# Patient Record
Sex: Female | Born: 1945 | Race: White | Hispanic: No | Smoking: Never smoker
Health system: Southern US, Community
[De-identification: ages and names within clinical notes are randomized; demographics above are authoritative.]

## PROBLEM LIST (undated history)

## (undated) DIAGNOSIS — I10 Essential (primary) hypertension: Secondary | ICD-10-CM

## (undated) HISTORY — PX: APPENDECTOMY: SHX54

## (undated) HISTORY — PX: SHOULDER SURGERY: SHX246

---

## 2014-12-29 ENCOUNTER — Encounter: Payer: Self-pay | Admitting: *Deleted

## 2014-12-29 ENCOUNTER — Emergency Department
Admission: EM | Admit: 2014-12-29 | Discharge: 2014-12-29 | Disposition: A | Discharge status: Home / Self Care | Payer: No Typology Code available for payment source | Attending: Emergency Medicine | Admitting: Emergency Medicine

## 2014-12-29 ENCOUNTER — Emergency Department: Payer: No Typology Code available for payment source

## 2014-12-29 DIAGNOSIS — S8001XA Contusion of right knee, initial encounter: Secondary | ICD-10-CM | POA: Diagnosis not present

## 2014-12-29 DIAGNOSIS — Y998 Other external cause status: Secondary | ICD-10-CM | POA: Insufficient documentation

## 2014-12-29 DIAGNOSIS — S60222A Contusion of left hand, initial encounter: Secondary | ICD-10-CM | POA: Insufficient documentation

## 2014-12-29 DIAGNOSIS — I10 Essential (primary) hypertension: Secondary | ICD-10-CM | POA: Insufficient documentation

## 2014-12-29 DIAGNOSIS — Y9389 Activity, other specified: Secondary | ICD-10-CM | POA: Diagnosis not present

## 2014-12-29 DIAGNOSIS — Y92410 Unspecified street and highway as the place of occurrence of the external cause: Secondary | ICD-10-CM | POA: Diagnosis not present

## 2014-12-29 DIAGNOSIS — T148XXA Other injury of unspecified body region, initial encounter: Secondary | ICD-10-CM

## 2014-12-29 DIAGNOSIS — S80211A Abrasion, right knee, initial encounter: Secondary | ICD-10-CM | POA: Diagnosis not present

## 2014-12-29 DIAGNOSIS — M7918 Myalgia, other site: Secondary | ICD-10-CM

## 2014-12-29 DIAGNOSIS — S5012XA Contusion of left forearm, initial encounter: Secondary | ICD-10-CM | POA: Insufficient documentation

## 2014-12-29 DIAGNOSIS — S6992XA Unspecified injury of left wrist, hand and finger(s), initial encounter: Secondary | ICD-10-CM | POA: Diagnosis present

## 2014-12-29 HISTORY — DX: Essential (primary) hypertension: I10

## 2014-12-29 MED ORDER — DIAZEPAM 2 MG PO TABS: 2.0000 mg | ORAL_TABLET | Freq: Three times a day (TID) | ORAL | Status: AC | PRN

## 2014-12-29 MED ORDER — NAPROXEN 500 MG PO TABS: 500.0000 mg | ORAL_TABLET | Freq: Two times a day (BID) | ORAL | Status: AC

## 2014-12-29 MED ORDER — IBUPROFEN 600 MG PO TABS
600.0000 mg | ORAL_TABLET | Freq: Once | ORAL | Status: AC
  Administered 2014-12-29: 600 mg via ORAL
  Filled 2014-12-29: qty 1

## 2014-12-29 NOTE — ED Notes (Signed)
Portable xray at bedside.
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
## 2014-12-29 NOTE — ED Notes (Signed)
Shelley Boroughs, NP notified of BP.  OK to discharge.  Patient to follow up with PCP re/ B/P
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
## 2015-02-22 ENCOUNTER — Other Ambulatory Visit: Payer: Self-pay | Admitting: Family Medicine

## 2015-02-22 DIAGNOSIS — Z1231 Encounter for screening mammogram for malignant neoplasm of breast: Secondary | ICD-10-CM

## 2015-03-16 ENCOUNTER — Ambulatory Visit
Admission: RE | Admit: 2015-03-16 | Discharge: 2015-03-16 | Disposition: A | Discharge status: Home / Self Care | Payer: Medicare Other | Source: Ambulatory Visit | Attending: Family Medicine | Admitting: Family Medicine

## 2015-03-16 ENCOUNTER — Other Ambulatory Visit: Payer: Self-pay | Admitting: Family Medicine

## 2015-03-16 DIAGNOSIS — Z1231 Encounter for screening mammogram for malignant neoplasm of breast: Secondary | ICD-10-CM | POA: Insufficient documentation
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
## 2017-04-03 ENCOUNTER — Other Ambulatory Visit: Payer: Self-pay | Admitting: Family Medicine

## 2017-04-03 DIAGNOSIS — Z0000 Encounter for general adult medical examination without abnormal findings: Secondary | ICD-10-CM

## 2023-05-04 ENCOUNTER — Emergency Department
Admission: EM | Admit: 2023-05-04 | Discharge: 2023-05-04 | Disposition: A | Discharge status: Home / Self Care | Payer: Medicare Other | Attending: Emergency Medicine | Admitting: Emergency Medicine

## 2023-05-04 ENCOUNTER — Other Ambulatory Visit: Payer: Self-pay

## 2023-05-04 DIAGNOSIS — Z23 Encounter for immunization: Secondary | ICD-10-CM | POA: Insufficient documentation

## 2023-05-04 DIAGNOSIS — S51812A Laceration without foreign body of left forearm, initial encounter: Secondary | ICD-10-CM | POA: Diagnosis present

## 2023-05-04 DIAGNOSIS — W01198A Fall on same level from slipping, tripping and stumbling with subsequent striking against other object, initial encounter: Secondary | ICD-10-CM | POA: Insufficient documentation

## 2023-05-04 MED ORDER — TETANUS-DIPHTH-ACELL PERTUSSIS 5-2.5-18.5 LF-MCG/0.5 IM SUSY
0.5000 mL | PREFILLED_SYRINGE | Freq: Once | INTRAMUSCULAR | Status: AC
  Administered 2023-05-04: 0.5 mL via INTRAMUSCULAR
  Filled 2023-05-04: qty 0.5

## 2023-05-04 MED ORDER — LIDOCAINE-EPINEPHRINE 2 %-1:100000 IJ SOLN
20.0000 mL | Freq: Once | INTRAMUSCULAR | Status: AC
  Administered 2023-05-04: 20 mL
  Filled 2023-05-04: qty 1

## 2023-05-04 NOTE — ED Provider Notes (Signed)
[facility] Provider Note    Event Date/Time   First MD Initiated Contact with Patient 05/04/23 1118     (approximate)   History   Laceration   HPI  Shelley Buck is a 77 y.o. female with no reported past medical history presents today for evaluation of left arm laceration.  Patient reports that last night she tripped and fell and hit her arm on the knob of a cabinet.  No numbness or tingling.  No active bleeding.  There are no problems to display for this patient.         Physical Exam   Triage Vital Signs: ED Triage Vitals  Encounter Vitals Group     BP 05/04/23 1056 (!) 147/91     Systolic BP Percentile --      Diastolic BP Percentile --      Pulse Rate 05/04/23 1056 94     Resp 05/04/23 1056 18     Temp 05/04/23 1056 97.7 F (36.5 C)     Temp Source 05/04/23 1056 Oral     SpO2 05/04/23 1056 97 %     Weight --      Height --      Head Circumference --      Peak Flow --      Pain Score 05/04/23 1058 4     Pain Loc --      Pain Education --      Exclude from Growth Chart --     Most recent vital signs: Vitals:   05/04/23 1056  BP: (!) 147/91  Pulse: 94  Resp: 18  Temp: 97.7 F (36.5 C)  SpO2: 97%    Physical Exam Vitals and nursing note reviewed.  Constitutional:      General: Awake and alert. No acute distress.    Appearance: Normal appearance. The patient is normal weight.  HENT:     Head: Normocephalic and atraumatic.     Mouth: Mucous membranes are moist.  Eyes:     General: PERRL. Normal EOMs        Right eye: No discharge.        Left eye: No discharge.     Conjunctiva/sclera: Conjunctivae normal.  Cardiovascular:     Rate and Rhythm: Normal rate and regular rhythm.     Pulses: Normal pulses.  Pulmonary:     Effort: Pulmonary effort is normal. No respiratory distress.     Breath sounds: Normal breath sounds.  Abdominal:     Abdomen is soft. There is no abdominal tenderness. No rebound or guarding. No  distention. Musculoskeletal:        General: No swelling. Normal range of motion.     Cervical back: Normal range of motion and neck supple.  Left arm: 7.6cm laceration to left forearm with visible subcutaneous fatty tissue, gaping approximately 1cm.  No active bleeding.  No hematoma.  No paresthesias.  Full and normal range of motion of fingers, wrist, elbow, shoulder.  Compartments soft compressible throughout. Skin:    General: Skin is warm and dry.     Capillary Refill: Capillary refill takes less than 2 seconds.     Findings: No rash.  Neurological:     Mental Status: The patient is awake and alert.      ED Results / Procedures / Treatments   Labs (all labs ordered are listed, but only abnormal results are displayed) Labs Reviewed - No data to display   EKG  RADIOLOGY     PROCEDURES:  Critical Care performed:   .Laceration Repair  Date/Time: 05/04/2023 12:12 PM  Performed by: Jackelyn Hoehn, PA-C Authorized by: Jackelyn Hoehn, PA-C   Consent:    Consent obtained:  Verbal   Consent given by:  Patient   Risks, benefits, and alternatives were discussed: yes     Risks discussed:  Infection, need for additional repair, nerve damage, pain, poor cosmetic result, poor wound healing, retained foreign body, tendon damage and vascular damage   Alternatives discussed:  No treatment Universal protocol:    Procedure explained and questions answered to patient or proxy's satisfaction: yes     Relevant documents present and verified: yes     Test results available: yes     Required blood products, implants, devices, and special equipment available: yes     Site/side marked: yes     Immediately prior to procedure, a time out was called: yes     Patient identity confirmed:  Verbally with patient Anesthesia:    Anesthesia method:  Local infiltration   Local anesthetic:  Lidocaine 1% WITH epi Laceration details:    Location:  Shoulder/arm   Shoulder/arm location:  L  lower arm   Length (cm):  7.6   Depth (mm):  0.8 Pre-procedure details:    Preparation:  Patient was prepped and draped in usual sterile fashion Exploration:    Limited defect created (wound extended): no     Hemostasis achieved with:  Epinephrine and direct pressure   Imaging outcome: foreign body not noted     Wound exploration: wound explored through full range of motion and entire depth of wound visualized     Wound extent: areolar tissue not violated, fascia not violated, no foreign body, no signs of injury, no nerve damage, no tendon damage, no underlying fracture and no vascular damage     Contaminated: no   Treatment:    Area cleansed with:  Saline and soap and water   Amount of cleaning:  Extensive   Irrigation solution:  Tap water   Irrigation volume:  5L   Irrigation method:  Tap and pressure wash   Debridement:  None   Undermining:  None   Scar revision: no   Skin repair:    Repair method:  Sutures   Suture size:  4-0   Suture material:  Nylon   Suture technique:  Simple interrupted   Number of sutures:  6 Approximation:    Approximation:  Loose Repair type:    Repair type:  Simple Post-procedure details:    Dressing:  Sterile dressing and non-adherent dressing   Procedure completion:  Tolerated well, no immediate complications    MEDICATIONS ORDERED IN ED: Medications  lidocaine-EPINEPHrine (XYLOCAINE W/EPI) 2 %-1:100000 (with pres) injection 20 mL (20 mLs Infiltration Given 05/04/23 1138)  Tdap (BOOSTRIX) injection 0.5 mL (0.5 mLs Intramuscular Given 05/04/23 1134)     IMPRESSION / MDM / ASSESSMENT AND PLAN / ED COURSE  I reviewed the triage vital signs and the nursing notes.   Differential diagnosis includes, but is not limited to, laceration, contusion, hematoma.  Patient is awake and alert, hemodynamically stable and afebrile.  She has a laceration to her forearm that is hemostatic.  There is visible subcutaneous fatty tissue.  Full depth of wound was  probed and evaluated, no retained foreign body.  She has normal strength and sensation of her fingers, wrist, elbow, I do not suspect tendon injury.  No bony tenderness to suggest  fracture.  Wound was anesthetized and irrigated.  It was closed with sutures.  Patient was educated on suture care and timeline for removal.  We discussed return precautions.  Tdap updated.  Patient understands and agrees with plan.  Discharged in stable condition.   Patient's presentation is most consistent with acute complicated illness / injury requiring diagnostic workup.    FINAL CLINICAL IMPRESSION(S) / ED DIAGNOSES   Final diagnoses:  Laceration of left forearm, initial encounter     Rx / DC Orders   ED Discharge Orders     None        Note:  This document was prepared using Dragon voice recognition software and may include unintentional dictation errors.   Jackelyn Hoehn, PA-C 05/04/23 1225    Janith Lima, MD 05/04/23 [phone]
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
## 2023-05-04 NOTE — Discharge Instructions (Signed)
 You were evaluated in the emergency department for a laceration. It was repaired with sutures. Keep the area clean and dry.  Wash multiple times per day with soap and water.  Do not go into the ocean or swimming pool.  Return to the emergency department or your primary care physician's office in 10 days for suture removal.  Return to the emergency department for:  -- Fever > 100.54F -- Increase pain in the wound -- Increase redness and swelling -- Pus coming from the wound -- Wound bleeds more than a small amount or it does not stop -- Wound edges come apart -- Severe pain -- Weakness or numbness in the affected area  Or any other new or worsening symptoms. It was a pleasure caring for you.

## 2023-05-15 ENCOUNTER — Emergency Department
Admission: EM | Admit: 2023-05-15 | Discharge: 2023-05-15 | Disposition: A | Discharge status: Home / Self Care | Payer: Medicare Other | Attending: Student | Admitting: Student

## 2023-05-15 ENCOUNTER — Other Ambulatory Visit: Payer: Self-pay

## 2023-05-15 ENCOUNTER — Encounter: Payer: Self-pay | Admitting: Emergency Medicine

## 2023-05-15 DIAGNOSIS — L03114 Cellulitis of left upper limb: Secondary | ICD-10-CM | POA: Diagnosis not present

## 2023-05-15 DIAGNOSIS — Z5189 Encounter for other specified aftercare: Secondary | ICD-10-CM | POA: Insufficient documentation

## 2023-05-15 DIAGNOSIS — Z4802 Encounter for removal of sutures: Secondary | ICD-10-CM | POA: Diagnosis not present

## 2023-05-15 MED ORDER — DOXYCYCLINE MONOHYDRATE 100 MG PO TABS: 100.0000 mg | ORAL_TABLET | Freq: Two times a day (BID) | ORAL | 0 refills | Status: AC

## 2023-05-15 MED ORDER — CEPHALEXIN 500 MG PO CAPS: 500.0000 mg | ORAL_CAPSULE | Freq: Four times a day (QID) | ORAL | 0 refills | Status: AC

## 2023-05-15 NOTE — ED Provider Notes (Signed)
[facility] Provider Note    None    (approximate)   History   Wound Check   HPI  Shelley Buck is a 77 y.o. female who presents today with request for wound check and suture removal.  Patient had her sutures placed 12 days ago.  She reports that she has noticed some redness over the last couple of days and mild pain and itchiness.  She has not noticed any drainage.  She has not had any fevers or chills.  She has been using her arm normally.  No other complaints today.  There are no active problems to display for this patient.         Physical Exam   Triage Vital Signs: ED Triage Vitals  Encounter Vitals Group     BP      Systolic BP Percentile      Diastolic BP Percentile      Pulse      Resp      Temp      Temp src      SpO2      Weight      Height      Head Circumference      Peak Flow      Pain Score      Pain Loc      Pain Education      Exclude from Growth Chart     Most recent vital signs: Vitals:   05/15/23 1027 05/15/23 1028  BP:  (!) 143/76  Pulse: 69   Resp: 18   Temp: 97.9 F (36.6 C)   SpO2: 100%     Physical Exam Vitals and nursing note reviewed.  Constitutional:      General: Awake and alert. No acute distress.    Appearance: Normal appearance. The patient is normal weight.  HENT:     Head: Normocephalic and atraumatic.     Mouth: Mucous membranes are moist.  Eyes:     General: PERRL. Normal EOMs        Right eye: No discharge.        Left eye: No discharge.     Conjunctiva/sclera: Conjunctivae normal.  Cardiovascular:     Rate and Rhythm: Normal rate and regular rhythm.     Pulses: Normal pulses.  Pulmonary:     Effort: Pulmonary effort is normal. No respiratory distress.     Breath sounds: Normal breath sounds.  Abdominal:     Abdomen is soft. There is no abdominal tenderness. No rebound or guarding. No distention. Musculoskeletal:        General: No swelling. Normal range of motion.      Cervical back: Normal range of motion and neck supple.  Left forearm with 6 intact sutures, with mild surrounding erythema extending approximately 0.5 cm from the wound site.  There is a very small amount of wound dehiscence at the proximal part of the laceration measuring approximately 2 mm.  There is no crepitus.  No tenderness to palpation.  No lymphangitis.  No circumferential erythema.  She has full and normal range of motion of all fingers, wrist, elbow, shoulder.  Scant yellow drainage at the area of dehiscence. Skin:    General: Skin is warm and dry.     Capillary Refill: Capillary refill takes less than 2 seconds.     Findings: No rash.  Neurological:     Mental Status: The patient is awake and alert.      ED Results /  Procedures / Treatments   Labs (all labs ordered are listed, but only abnormal results are displayed) Labs Reviewed - No data to display   EKG     RADIOLOGY     PROCEDURES:  Critical Care performed:   Suture Removal  Date/Time: 05/16/2023 7:39 AM  Performed by: Jackelyn Hoehn, PA-C Authorized by: Jackelyn Hoehn, PA-C   Consent:    Consent obtained:  Verbal   Consent given by:  Patient   Risks, benefits, and alternatives were discussed: yes     Risks discussed:  Bleeding, pain and wound separation   Alternatives discussed:  No treatment Universal protocol:    Procedure explained and questions answered to patient or proxy's satisfaction: yes     Relevant documents present and verified: yes     Test results available: yes     Required blood products, implants, devices, and special equipment available: yes     Site/side marked: yes     Immediately prior to procedure, a time out was called: yes     Patient identity confirmed:  Verbally with patient Location:    Location:  Upper extremity   Upper extremity location:  Arm   Arm location:  L lower arm Procedure details:    Wound appearance:  Pink   Number of sutures removed:  6   Number of  staples removed:  0 Post-procedure details:    Post-removal:  No dressing applied   Procedure completion:  Tolerated well, no immediate complications    MEDICATIONS ORDERED IN ED: Medications - No data to display   IMPRESSION / MDM / ASSESSMENT AND PLAN / ED COURSE  I reviewed the triage vital signs and the nursing notes.   Differential diagnosis includes, but is not limited to, wound check, suture removal, cellulitis, reaction to sutures.  Patient is awake and alert, hemodynamically stable and afebrile.  I reviewed the patient's chart.  Patient had 6 sutures placed on 05/04/2023.  Physical exam is consistent with localized suture reaction versus developing cellulitis. No crepitus or bullae or hemodynamic instability or pain out of proportion to indicate deep space infection. No fluctuance to suggest cystic lesion such as abscess. No fevers or constitutional symptoms to suggest systemic infection.  Physical exam does not show any evidence of joint involvement. No lymphangitis.  Given that there is a very small amount of wound dehiscence at the proximal part of the laceration with scant yellow discharge will treat with Keflex and Doxy for developing cellulitis.  Sutures removed without difficulty, no further wound dehiscence.  Patient will initiate oral antibiotics and follow-up closely as an outpatient. Return if worsening or developing new symptoms in which case may require IV antibiotics. Discussed care plan, return precautions, and advised close outpatient follow-up. Patient agrees with plan of care.  She was discharged in stable condition.   Patient's presentation is most consistent with acute illness / injury with system symptoms.    FINAL CLINICAL IMPRESSION(S) / ED DIAGNOSES   Final diagnoses:  Visit for wound check  Visit for suture removal  Cellulitis of left upper extremity     Rx / DC Orders   ED Discharge Orders          Ordered    cephALEXin (KEFLEX) 500 MG  capsule  4 times daily        05/15/23 1028    doxycycline (ADOXA) 100 MG tablet  2 times daily        05/15/23 1032  Note:  This document was prepared using Dragon voice recognition software and may include unintentional dictation errors.   Jackelyn Hoehn, PA-C 05/16/23 0745    Janith Lima, MD 05/17/23 [phone]

## 2023-05-15 NOTE — ED Provider Triage Note (Signed)
Emergency Medicine Provider Triage Evaluation Note  Shelley Buck , a 77 y.o. female  was evaluated in triage.  Pt complains of request for suture removal. No complaints. Placed 05/04/23. No pain. No fever. Not itching.  Review of Systems  Positive: sutures Negative: fever  Physical Exam  There were no vitals taken for this visit. Gen:   Awake, no distress   Resp:  Normal effort  MSK:   Moves extremities without difficulty. Mild redness to suture site  Other:    Medical Decision Making  Medically screening exam initiated at 10:24 AM.  Appropriate orders placed.  Erendida Scire was informed that the remainder of the evaluation will be completed by another provider, this initial triage assessment does not replace that evaluation, and the importance of remaining in the ED until their evaluation is complete.     Jackelyn Hoehn, PA-C 05/15/23 1026

## 2023-05-15 NOTE — Discharge Instructions (Addendum)
Take the antibiotics as prescribed. Keep the area clean with soap and water. Return for new, worsening or change in symptoms or other concerns.

## 2023-05-15 NOTE — ED Triage Notes (Signed)
Pt presents from home for suture removal, in triage upon exam noted that area to forearm is red. EDP in room at time of triage.  Denies fevers, sutures placed 12 days ago.
